# Patient Record
Sex: Female | Born: 1965 | Race: White | Hispanic: No | Marital: Married | Smoking: Never smoker
Health system: Southern US, Community
[De-identification: ages and names within clinical notes are randomized; demographics above are authoritative.]

## PROBLEM LIST (undated history)

## (undated) DIAGNOSIS — I10 Essential (primary) hypertension: Secondary | ICD-10-CM

## (undated) DIAGNOSIS — H269 Unspecified cataract: Secondary | ICD-10-CM

## (undated) DIAGNOSIS — H53009 Unspecified amblyopia, unspecified eye: Secondary | ICD-10-CM

## (undated) DIAGNOSIS — G459 Transient cerebral ischemic attack, unspecified: Secondary | ICD-10-CM

## (undated) HISTORY — DX: Unspecified amblyopia, unspecified eye: H53.009

## (undated) HISTORY — DX: Transient cerebral ischemic attack, unspecified: G45.9

## (undated) HISTORY — DX: Unspecified cataract: H26.9

## (undated) HISTORY — DX: Essential (primary) hypertension: I10

---
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
## 2006-06-14 HISTORY — PX: ECTOPIC PREGNANCY SURGERY: SHX613

## 2007-01-01 ENCOUNTER — Other Ambulatory Visit: Payer: Self-pay

## 2007-01-01 ENCOUNTER — Observation Stay: Payer: Self-pay | Admitting: Obstetrics & Gynecology

## 2007-01-13 HISTORY — PX: ECTOPIC PREGNANCY SURGERY: SHX613

## 2007-10-13 DIAGNOSIS — G459 Transient cerebral ischemic attack, unspecified: Secondary | ICD-10-CM

## 2007-10-13 HISTORY — DX: Transient cerebral ischemic attack, unspecified: G45.9

## 2007-11-10 ENCOUNTER — Inpatient Hospital Stay: Payer: Self-pay | Admitting: Internal Medicine

## 2007-11-10 ENCOUNTER — Other Ambulatory Visit: Payer: Self-pay

## 2007-11-13 ENCOUNTER — Ambulatory Visit: Payer: Self-pay | Admitting: *Deleted

## 2010-09-09 ENCOUNTER — Other Ambulatory Visit: Payer: Self-pay | Admitting: Internal Medicine

## 2010-10-01 ENCOUNTER — Ambulatory Visit: Payer: Self-pay | Admitting: Cardiology

## 2010-11-10 ENCOUNTER — Ambulatory Visit: Payer: Self-pay | Admitting: Obstetrics & Gynecology

## 2010-11-16 ENCOUNTER — Ambulatory Visit: Payer: Self-pay | Admitting: Obstetrics & Gynecology

## 2010-11-20 ENCOUNTER — Ambulatory Visit: Payer: Self-pay | Admitting: Obstetrics & Gynecology

## 2011-01-15 ENCOUNTER — Ambulatory Visit: Payer: Self-pay

## 2011-01-15 ENCOUNTER — Other Ambulatory Visit: Payer: Self-pay | Admitting: Radiology

## 2012-01-07 ENCOUNTER — Ambulatory Visit: Payer: Self-pay

## 2012-01-07 LAB — HCG, QUANTITATIVE, PREGNANCY: Beta Hcg, Quant.: <1 m[IU]/mL — ABNORMAL LOW

## 2012-08-02 IMAGING — MG MAM DGTL SCREENING MAMMO W/CAD
1 series · 4 of 4 positions shown · non-contrast
Comparison: 01/16/2004 from [REDACTED].

REASON FOR EXAM: screening
COMMENTS:  Submitted by practice: [facility] Scheduled by user: Darshna
Ylander

PROCEDURE:     MAM - MAM DGTL SCREENING MAMMO W/CAD  - November 16, 2010  [DATE]
RESULT:

[Series 866: R CC · right · 4 of 4 slices shown]
[im 1/4]
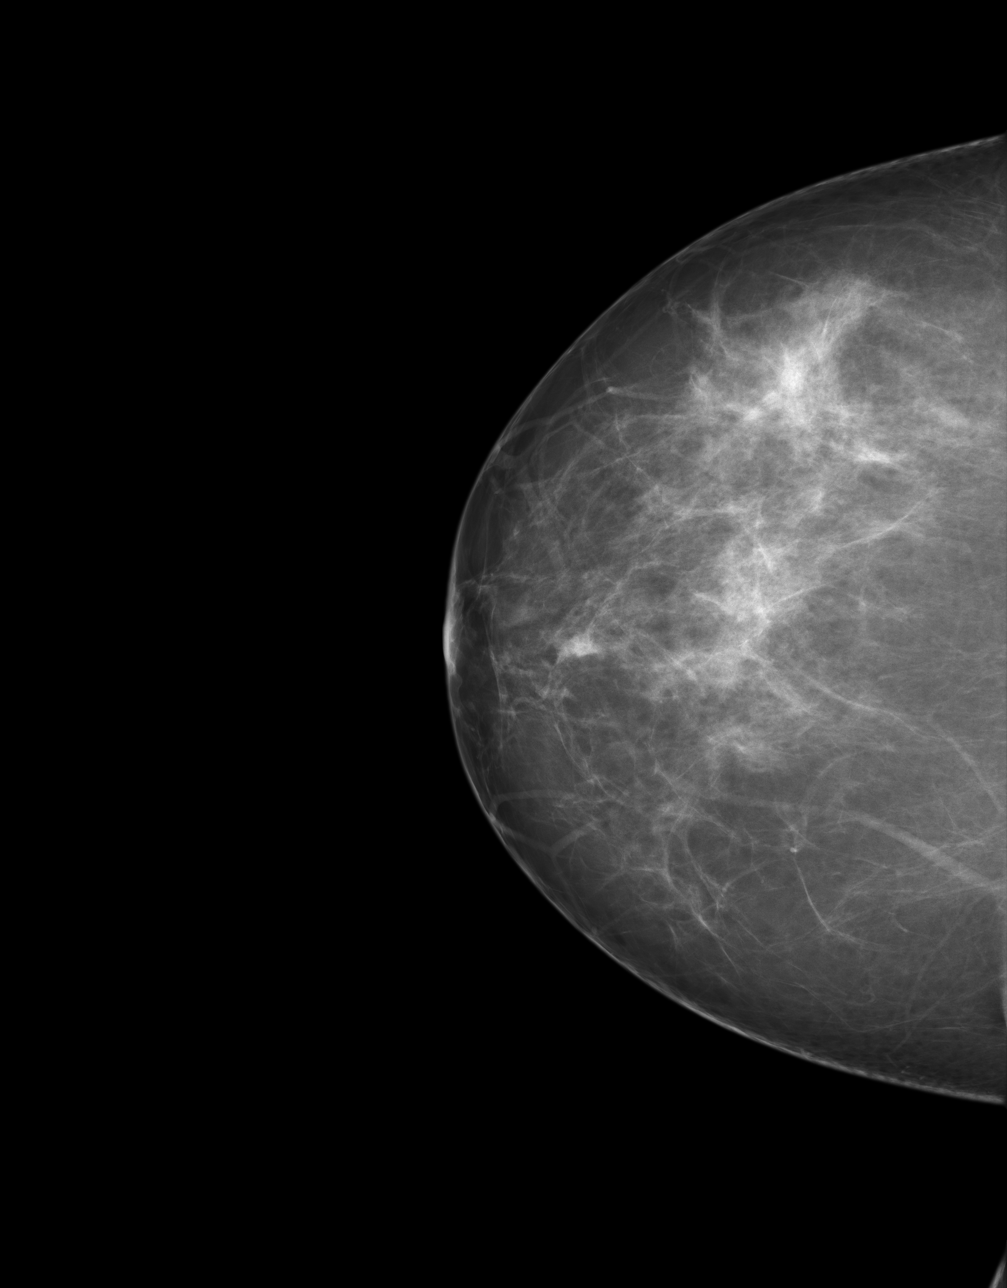
[im 2/4]
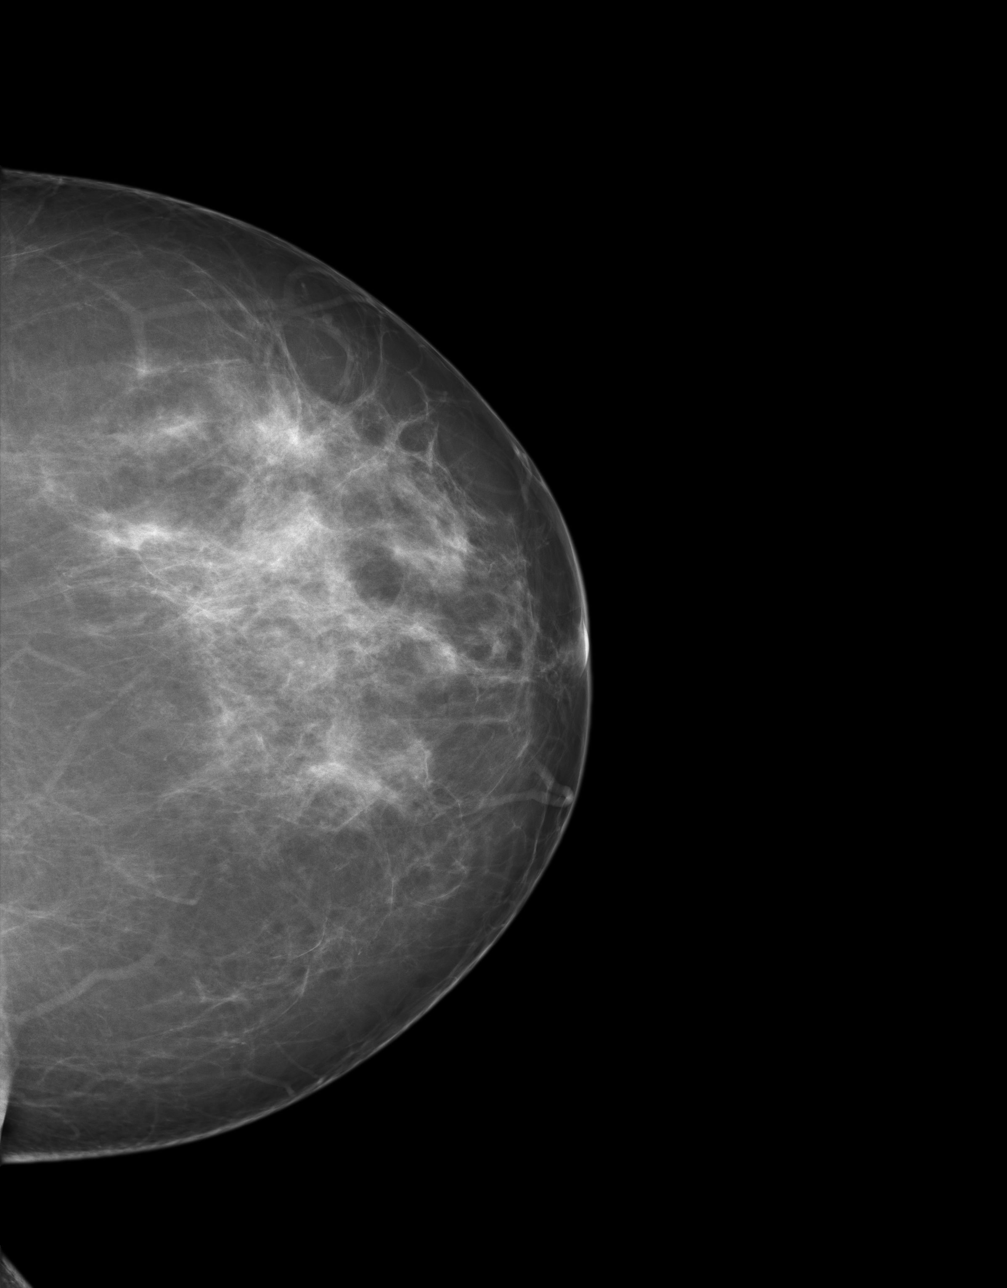
[im 3/4]
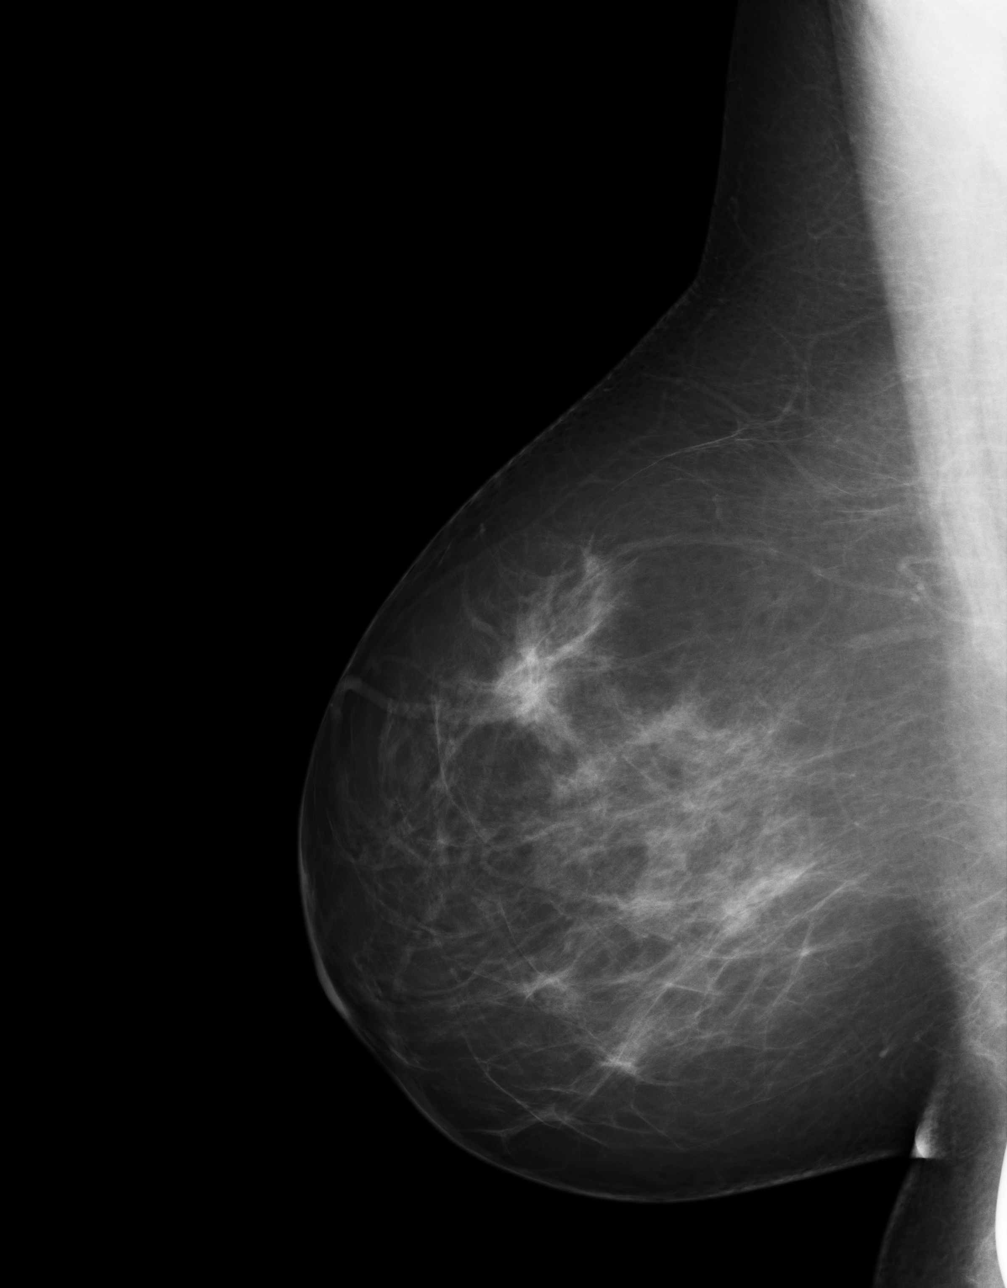
[im 4/4]
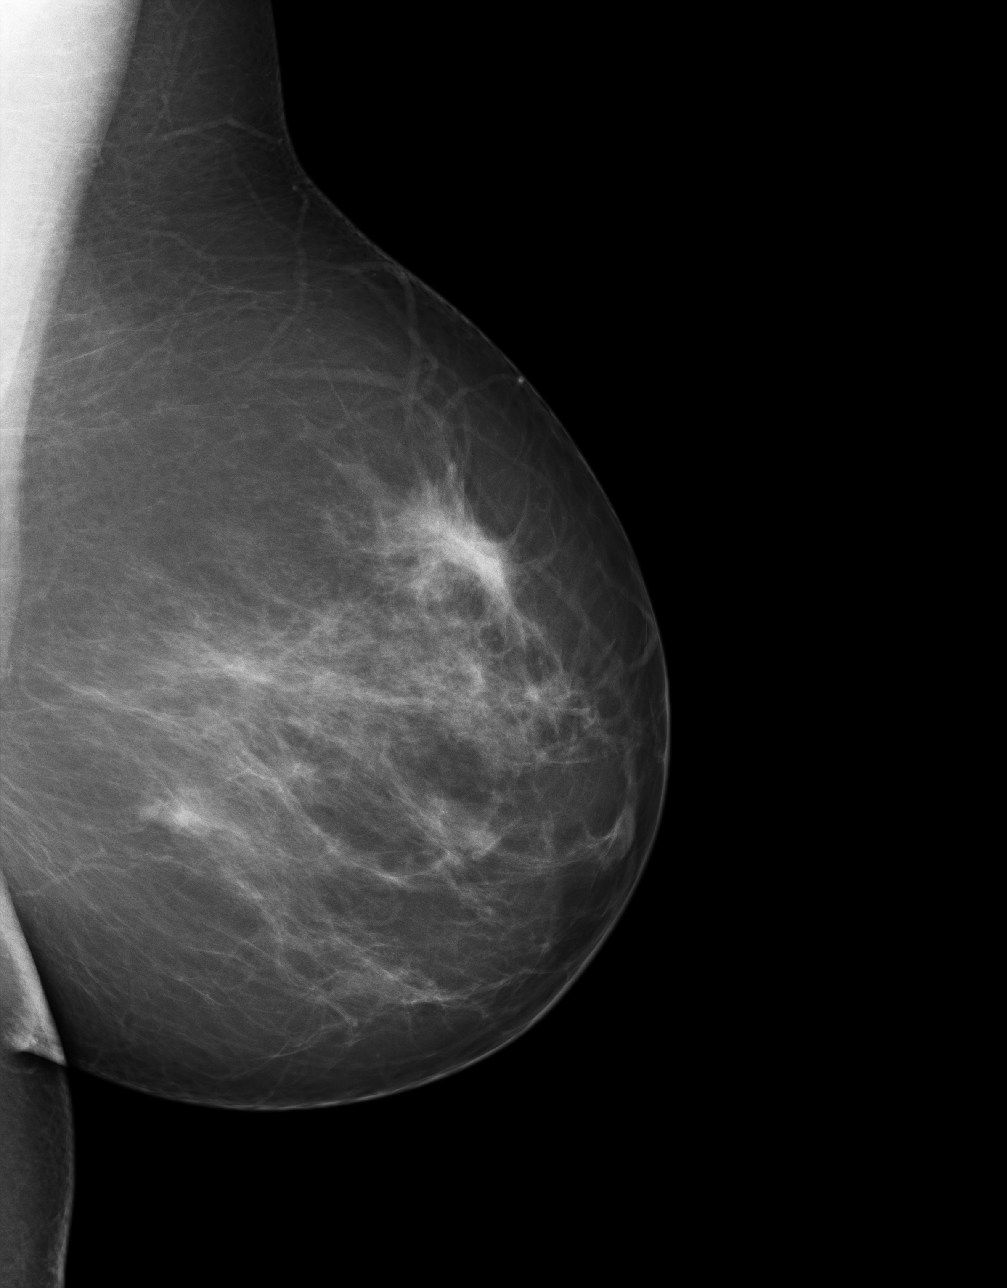

[4 of 4 positions shown; findings below may reference images not displayed]

FINDINGS: Bilateral breasts demonstrate a scattered fibroglandular density.
There is no new dominant mass, architectural distortion or clusters of
suspicious microcalcifications. There is an area of asymmetry in the lateral
right breast.
IMPRESSION: Slight asymmetry in the lateral right breast. Recommend
spot compression views for further evaluation.

BI-RADS:  Category 0 - Needs Additional Imaging Evaluation

Thank you for this opportunity to contribute to the care of your patient.

A NEGATIVE MAMMOGRAM REPORT DOES NOT PRECLUDE BIOPSY OR OTHER EVALUATION OF
A CLINICALLY PALPABLE OR OTHERWISE SUSPICIOUS MASS OR LESION. BREAST CANCER
MAY NOT BE DETECTED BY MAMMOGRAPHY IN UP TO 10% OF CASES.

## 2012-09-13 ENCOUNTER — Encounter: Payer: Self-pay | Admitting: General Surgery

## 2012-09-13 ENCOUNTER — Ambulatory Visit (INDEPENDENT_AMBULATORY_CARE_PROVIDER_SITE_OTHER): Payer: BC Managed Care – PPO | Admitting: General Surgery

## 2012-09-13 VITALS — BP 134/78 | HR 74 | Resp 14 | Ht 70.0 in | Wt 225.0 lb

## 2012-09-13 DIAGNOSIS — L729 Follicular cyst of the skin and subcutaneous tissue, unspecified: Secondary | ICD-10-CM

## 2012-09-13 DIAGNOSIS — L723 Sebaceous cyst: Secondary | ICD-10-CM

## 2012-09-13 NOTE — Patient Instructions (Addendum)
Patient to use heat and let us know if the area is getting bigger. Do not use creams or powders on the area. Keep the  area clean.

## 2012-09-13 NOTE — Progress Notes (Signed)
Patient ID: Ellen Clark, female   DOB: 02/23/1966, 47 y.o.   MRN: [id number]  Chief Complaint  Patient presents with  . Other    left axilla    HPI Ellen Clark is a 47 y.o. female here today for her left axiliary lump, has been hurting about three week.She feels the lump is less notable now. Never had any breast problems before.  HPI  Past Medical History  Diagnosis Date  . TIA (transient ischemic attack) May 2009  . Cataract     congenital right  . 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 Ectopic pregnancy surgery  Aug 2008  . Hernia repair  1974  . Ectopic pregnancy surgery  2008    Family History  Problem Relation Age of Onset  . Lung disease Mother     lung transplant  . Heart disease Father     MI  . Cancer Father     prostate    Social History History  Substance Use Topics  . Smoking status: Never Smoker   . Smokeless tobacco: Never Used  . Alcohol Use: Yes     Comment: 2-3 glasss wine/week    Allergies  Allergen Reactions  . Ace Inhibitors Cough  . Erythromycin Nausea And Vomiting  . Shellfish Allergy     scallops    Current Outpatient Prescriptions  Medication Sig Dispense Refill  . aspirin 81 MG tablet Take 81 mg by mouth daily.      . chlorthalidone (HYGROTON) 25 MG tablet Take 25 mg by mouth daily.       Marland Kitchen loratadine (CLARITIN) 10 MG tablet Take 10 mg by mouth daily as needed for allergies.      . Multiple Vitamins-Minerals (MULTIVITAMIN WITH MINERALS) tablet Take 1 tablet by mouth daily.      Marland Kitchen oxymetazoline (AFRIN) 0.05 % nasal spray Place 2 sprays into the nose 2 (two) times daily as needed for congestion.      . phenylephrine (SUDAFED PE) 10 MG TABS Take 10 mg by mouth every 4 (four) hours as needed.       No current facility-administered medications for this visit.    Review of Systems Review of Systems  Constitutional: Negative.   Respiratory: Negative.     There were no vitals taken for this  visit.  Physical Exam Physical Exam  Neck: No mass and no thyromegaly present.  Cardiovascular: Normal rate, regular rhythm and normal heart sounds.   Pulmonary/Chest: Right breast exhibits no inverted nipple, no mass, no nipple discharge, no skin change and no tenderness. Left breast exhibits no inverted nipple, no mass, no nipple discharge, no skin change and no tenderness.  Lymphadenopathy:    She has no cervical adenopathy.    She has no axillary adenopathy (left ).  Skin:       Data Reviewed    Assessment    Skin cyst left axilla     Plan    No need for antibiotic or excision now.        Ples Specter 09/13/2012, 9:20 AM

## 2014-04-15 ENCOUNTER — Encounter: Payer: Self-pay | Admitting: General Surgery

## 2016-08-25 ENCOUNTER — Ambulatory Visit (INDEPENDENT_AMBULATORY_CARE_PROVIDER_SITE_OTHER): Payer: BC Managed Care – PPO | Admitting: Obstetrics & Gynecology

## 2016-08-25 ENCOUNTER — Encounter: Payer: Self-pay | Admitting: Obstetrics & Gynecology

## 2016-08-25 VITALS — BP 120/80 | HR 80 | Ht 70.0 in | Wt 233.0 lb

## 2016-08-25 DIAGNOSIS — Z0000 Encounter for general adult medical examination without abnormal findings: Secondary | ICD-10-CM

## 2016-08-25 NOTE — Progress Notes (Signed)
HPI:      [name] is a 51 y.o. G2P0010 who LMP was Patient's last menstrual period was 08/11/2016.she presents today for her annual examination. The patient has no complaints today. The patient is sexually active. 2016  last pap: was normal  The patient has regular exercise: yes.  Reg periods, no vasomotor c/os.  On Cabergoline (Dr Tedd Sias) which has helped regulate periods.  GYN History: Contraception: tubal ligation  PMHx: She  has a past medical history of Cataract; Hypertension; Lazy eye (1980); and TIA (transient ischemic attack) (May 2009). Also,  has a past surgical history that includes Ectopic pregnancy surgery (Aug 2008); Hernia repair (1974); and Ectopic pregnancy surgery (2008)., family history includes Cancer in her father; Heart disease in her father; Lung disease in her mother.,  reports that she has never smoked. She has never used smokeless tobacco. She reports that she drinks alcohol. She reports that she does not use drugs.  She has a current medication list which includes the following prescription(s): cabergoline, triamterene-hydrochlorothiazide, aspirin ec, loratadine, and multivitamin with minerals. Also, is allergic to ace inhibitors; erythromycin; and shellfish allergy.  Review of Systems  Constitutional: Negative for chills, fever and malaise/fatigue.  HENT: Negative for congestion, sinus pain and sore throat.   Eyes: Negative for blurred vision and pain.  Respiratory: Negative for cough and wheezing.   Cardiovascular: Negative for chest pain and leg swelling.  Gastrointestinal: Negative for abdominal pain, constipation, diarrhea, heartburn, nausea and vomiting.  Genitourinary: Negative for dysuria, frequency, hematuria and urgency.  Musculoskeletal: Negative for back pain, joint pain, myalgias and neck pain.  Skin: Negative for itching and rash.  Neurological: Negative for dizziness, tremors and weakness.  Endo/Heme/Allergies: Does not bruise/bleed easily.   Psychiatric/Behavioral: Negative for depression. The patient is not nervous/anxious and does not have insomnia.     Objective: BP 120/80   Pulse 80   Ht 5\' 10"  (1.778 m)   Wt 233 lb (105.7 kg)   LMP 08/11/2016   BMI 33.43 kg/m  Physical Exam  Constitutional: She is oriented to person, place, and time. She appears well-developed and well-nourished. No distress.  Genitourinary: Rectum normal, vagina normal and uterus normal. Pelvic exam was performed with patient supine. There is no rash or lesion on the right labia. There is no rash or lesion on the left labia. Vagina exhibits no lesion. No bleeding in the vagina. Right adnexum does not display mass and does not display tenderness. Left adnexum does not display mass and does not display tenderness. Cervix does not exhibit motion tenderness, lesion, friability or polyp.   Uterus is mobile and midaxial. Uterus is not enlarged or exhibiting a mass.  HENT:  Head: Normocephalic and atraumatic. Head is without laceration.  Right Ear: Hearing normal.  Left Ear: Hearing normal.  Nose: No epistaxis.  No foreign bodies.  Mouth/Throat: Uvula is midline, oropharynx is clear and moist and mucous membranes are normal.  Eyes: Pupils are equal, round, and reactive to light.  Neck: Normal range of motion. Neck supple. No thyromegaly present.  Cardiovascular: Normal rate and regular rhythm.  Exam reveals no gallop and no friction rub.   No murmur heard. Pulmonary/Chest: Effort normal and breath sounds normal. No respiratory distress. She has no wheezes.  Abdominal: Soft. Bowel sounds are normal. She exhibits no distension. There is no tenderness. There is no rebound.  Musculoskeletal: Normal range of motion.  Neurological: She is alert and oriented to person, place, and time. No cranial nerve  deficit.  Skin: Skin is warm and dry.  Psychiatric: She has a normal mood and affect. Judgment normal.  Vitals reviewed.   Assessment:  ANNUAL EXAM 1. Annual  physical exam      Screening Plan:            1.  Cervical Screening-  Pap smear not performed, every 3 years (2019)  2. Breast screening- Exam annually and mammogram>40 planned   3. Colonoscopy every 10 years, Hemoccult testing - after age 49  4. Labs per PCP   5. Counseling for contraception: bilateral tubal ligation     F/U  Return in about 1 year (around 08/25/2017) for Annual with Mammogram.  Annamarie MajorPaul Valentine Kuechle, MD, Merlinda FrederickFACOG [facility], [facility] 08/25/2016  9:32 AM

## 2020-02-13 ENCOUNTER — Other Ambulatory Visit: Payer: Self-pay

## 2020-02-13 ENCOUNTER — Other Ambulatory Visit (HOSPITAL_COMMUNITY)
Admission: RE | Admit: 2020-02-13 | Discharge: 2020-02-13 | Disposition: A | Discharge status: Home / Self Care | Payer: BC Managed Care – PPO | Source: Ambulatory Visit | Attending: Obstetrics & Gynecology | Admitting: Obstetrics & Gynecology

## 2020-02-13 ENCOUNTER — Ambulatory Visit (INDEPENDENT_AMBULATORY_CARE_PROVIDER_SITE_OTHER): Payer: BC Managed Care – PPO | Admitting: Obstetrics & Gynecology

## 2020-02-13 ENCOUNTER — Encounter: Payer: Self-pay | Admitting: Obstetrics & Gynecology

## 2020-02-13 VITALS — BP 120/80 | Ht 70.0 in | Wt 246.0 lb

## 2020-02-13 DIAGNOSIS — Z01419 Encounter for gynecological examination (general) (routine) without abnormal findings: Secondary | ICD-10-CM

## 2020-02-13 DIAGNOSIS — Z1231 Encounter for screening mammogram for malignant neoplasm of breast: Secondary | ICD-10-CM

## 2020-02-13 DIAGNOSIS — Z124 Encounter for screening for malignant neoplasm of cervix: Secondary | ICD-10-CM | POA: Diagnosis not present

## 2020-02-13 NOTE — Patient Instructions (Addendum)
PAP every three years Mammogram every year    Call [phone] to schedule at [facility] Colonoscopy every 10 years Labs yearly (with PCP)  Thank you for choosing [facility]. As part of our ongoing efforts to improve patient experience, we would appreciate your feedback. Please fill out the short survey that you will receive by mail or MyChart. Your opinion is important to [place]! - Dr. Tiburcio Pea   Kegel Exercises  Kegel exercises can help strengthen your pelvic floor muscles. The pelvic floor is a group of muscles that support your rectum, small intestine, and bladder. In females, pelvic floor muscles also help support the womb (uterus). These muscles help you control the flow of urine and stool. Kegel exercises are painless and simple, and they do not require any equipment. Your provider may suggest Kegel exercises to:  Improve bladder and bowel control.  Improve sexual response.  Improve weak pelvic floor muscles after surgery to remove the uterus (hysterectomy) or pregnancy (females).  Improve weak pelvic floor muscles after prostate gland removal or surgery (males). Kegel exercises involve squeezing your pelvic floor muscles, which are the same muscles you squeeze when you try to stop the flow of urine or keep from passing gas. The exercises can be done while sitting, standing, or lying down, but it is best to vary your position. Exercises How to do Kegel exercises: 1. Squeeze your pelvic floor muscles tight. You should feel a tight lift in your rectal area. If you are a female, you should also feel a tightness in your vaginal area. Keep your stomach, buttocks, and legs relaxed. 2. Hold the muscles tight for up to 10 seconds. 3. Breathe normally. 4. Relax your muscles. 5. Repeat as told by your health care provider. Repeat this exercise daily as told by your health care provider. Continue to do this exercise for at least 4-6 weeks, or for as long as told by your health care  provider. You may be referred to a physical therapist who can help you learn more about how to do Kegel exercises. Depending on your condition, your health care provider may recommend:  Varying how long you squeeze your muscles.  Doing several sets of exercises every day.  Doing exercises for several weeks.  Making Kegel exercises a part of your regular exercise routine. This information is not intended to replace advice given to you by your health care provider. Make sure you discuss any questions you have with your health care provider. Document Revised: 01/18/2018 Document Reviewed: 01/18/2018 Elsevier Patient Education  2020 [employer].

## 2020-02-13 NOTE — Progress Notes (Signed)
HPI:      [name] is a 54 y.o. G2P0010 who LMP was Patient's last menstrual period was 01/30/2020., she presents today for her annual examination. The patient has the following complaints today. Periods more irreg but monthly (q 3-6 weeks); occas gray or other discolorations.  Feels vag know for about a year.  Occas hot fhashes.  Weight gain.  Still has prolactin concerns on meds uder control.  LOU w cough or vomiting, rare other times.  The patient is sexually active. Her last pap: approximate date 2018 and was normal and last mammogram: approximate date 2018 and was normal. The patient does perform self breast exams.  There is no notable family history of breast or ovarian cancer in her family.  The patient has regular exercise: yes.  The patient denies current symptoms of depression.    GYN History: Contraception: tubal ligation  PMHx: Past Medical History:  Diagnosis Date  . Cataract    congenital right  . Hypertension   . Lazy eye 1980   right  . TIA (transient ischemic attack) May 2009   Past Surgical History:  Procedure Laterality Date  . ECTOPIC PREGNANCY SURGERY  Aug 2008  . ECTOPIC PREGNANCY SURGERY  2008  . HERNIA REPAIR  1974   Family History  Problem Relation Age of Onset  . Lung disease Mother        lung transplant  . Heart disease Father        MI  . Cancer Father        prostate   Social History   Tobacco Use  . Smoking status: Never Smoker  . Smokeless tobacco: Never Used  Vaping Use  . Vaping Use: Never used  Substance Use Topics  . Alcohol use: Yes    Comment: 2-3 glasss wine/week  . Drug use: No    Current Outpatient Medications:  .  aspirin EC 81 MG tablet, Take by mouth., Disp: , Rfl:  .  cabergoline (DOSTINEX) 0.5 MG tablet, Take by mouth., Disp: , Rfl:  .  loratadine (CLARITIN) 10 MG tablet, Take by mouth., Disp: , Rfl:  .  Multiple Vitamins-Minerals (MULTIVITAMIN WITH MINERALS) tablet, Take by mouth., Disp: , Rfl:  .   triamterene-hydrochlorothiazide (MAXZIDE-25) 37.5-25 MG tablet, Take by mouth., Disp: , Rfl:  Allergies: Ace inhibitors, Erythromycin, and Shellfish allergy  Review of Systems  Constitutional: Negative for chills, fever and malaise/fatigue.  HENT: Negative for congestion, sinus pain and sore throat.   Eyes: Negative for blurred vision and pain.  Respiratory: Negative for cough and wheezing.   Cardiovascular: Negative for chest pain and leg swelling.  Gastrointestinal: Negative for abdominal pain, constipation, diarrhea, heartburn, nausea and vomiting.  Genitourinary: Negative for dysuria, frequency, hematuria and urgency.  Musculoskeletal: Negative for back pain, joint pain, myalgias and neck pain.  Skin: Negative for itching and rash.  Neurological: Negative for dizziness, tremors and weakness.  Endo/Heme/Allergies: Does not bruise/bleed easily.  Psychiatric/Behavioral: Negative for depression. The patient is not nervous/anxious and does not have insomnia.     Objective: BP 120/80   Ht 5\' 10"  (1.778 m)   Wt 246 lb (111.6 kg)   LMP 01/30/2020   BMI 35.30 kg/m   Filed Weights   02/13/20 0830  Weight: 246 lb (111.6 kg)   Body mass index is 35.3 kg/m. Physical Exam Constitutional:      General: She is not in acute distress.    Appearance: She is well-developed.  Genitourinary:  Pelvic exam was performed with patient supine.     Vagina, uterus and rectum normal.     No lesions in the vagina.     No vaginal bleeding.     No cervical motion tenderness, friability, lesion or polyp.     Uterus is mobile.     Uterus is not enlarged.     No uterine mass detected.    Uterus is midaxial.     No right or left adnexal mass present.     Right adnexa not tender.     Left adnexa not tender.     Genitourinary Comments: Vag know pea sized NT on post vag wall near intraitus  HENT:     Head: Normocephalic and atraumatic. No laceration.     Right Ear: Hearing normal.     Left Ear:  Hearing normal.     Mouth/Throat:     Pharynx: Uvula midline.  Eyes:     Pupils: Pupils are equal, round, and reactive to light.  Neck:     Thyroid: No thyromegaly.  Cardiovascular:     Rate and Rhythm: Normal rate and regular rhythm.     Heart sounds: No murmur heard.  No friction rub. No gallop.   Pulmonary:     Effort: Pulmonary effort is normal. No respiratory distress.     Breath sounds: Normal breath sounds. No wheezing.  Chest:     Breasts:        Right: No mass, skin change or tenderness.        Left: No mass, skin change or tenderness.  Abdominal:     General: Bowel sounds are normal. There is no distension.     Palpations: Abdomen is soft.     Tenderness: There is no abdominal tenderness. There is no rebound.  Musculoskeletal:        General: Normal range of motion.     Cervical back: Normal range of motion and neck supple.  Neurological:     Mental Status: She is alert and oriented to person, place, and time.     Cranial Nerves: No cranial nerve deficit.  Skin:    General: Skin is warm and dry.  Psychiatric:        Judgment: Judgment normal.  Vitals reviewed.     Assessment:  ANNUAL EXAM 1. Women's annual routine gynecological examination   2. Screening for cervical cancer   3. Encounter for screening mammogram for malignant neoplasm of breast      Screening Plan:            1.  Cervical Screening-  Pap smear done today  2. Breast screening- Exam annually and mammogram>40 planned   3. Colonoscopy every 10 years, Hemoccult testing - after age 44  4. Labs managed by PCP  5. Counseling for contraception: bilateral tubal ligation   6.  No vaginal concerns, monitor nodule as it has no pain and no signs of cancer or disease process.  Monitor cycles and sx's of menopause.  7. Mild GSI, monitor sx's. Options discussed.  Kegels encouraged.     F/U  Return in about 1 year (around 02/12/2021) for Annual.  Ellen Major, MD, [facility], [facility] 02/13/2020  9:12 AM

## 2020-02-14 LAB — CYTOLOGY - PAP
Comment: NEGATIVE
Diagnosis: NEGATIVE
High risk HPV: NEGATIVE

## 2020-02-14 NOTE — Progress Notes (Signed)
Pt aware.

## 2020-03-19 ENCOUNTER — Ambulatory Visit
Admission: RE | Admit: 2020-03-19 | Discharge: 2020-03-19 | Disposition: A | Discharge status: Home / Self Care | Payer: BC Managed Care – PPO | Source: Ambulatory Visit | Attending: Obstetrics & Gynecology | Admitting: Obstetrics & Gynecology

## 2020-03-19 ENCOUNTER — Other Ambulatory Visit: Payer: Self-pay

## 2020-03-19 DIAGNOSIS — Z1231 Encounter for screening mammogram for malignant neoplasm of breast: Secondary | ICD-10-CM | POA: Diagnosis not present

## 2020-03-24 ENCOUNTER — Encounter: Payer: Self-pay | Admitting: Obstetrics & Gynecology

## 2022-01-21 ENCOUNTER — Encounter: Payer: Self-pay | Admitting: Licensed Practical Nurse

## 2022-01-21 ENCOUNTER — Ambulatory Visit (INDEPENDENT_AMBULATORY_CARE_PROVIDER_SITE_OTHER): Payer: BC Managed Care – PPO | Admitting: Licensed Practical Nurse

## 2022-01-21 VITALS — BP 120/70 | Ht 70.0 in | Wt 242.0 lb

## 2022-01-21 DIAGNOSIS — Z01419 Encounter for gynecological examination (general) (routine) without abnormal findings: Secondary | ICD-10-CM | POA: Diagnosis not present

## 2022-01-21 DIAGNOSIS — S20111A Abrasion of breast, right breast, initial encounter: Secondary | ICD-10-CM

## 2022-01-21 DIAGNOSIS — W57XXXA Bitten or stung by nonvenomous insect and other nonvenomous arthropods, initial encounter: Secondary | ICD-10-CM

## 2022-01-21 NOTE — Progress Notes (Signed)
Gynecology Annual Exam  PCP: Danella Penton, MD  Chief Complaint:  Chief Complaint  Patient presents with   Annual Exam    History of Present Illness:Patient is a 56 y.o. G2P0010 presents for annual exam. The patient has what she thinks is a bug bite on her right breast, it appeared about 2 days ago, it doe snot itch and is not painful.  Reports having a nodule in her right vaginal wall, it has been there for a number of years, it has not changed in size and does not cause any pain.   LMP: Patient's last menstrual period was 10/31/2021.  Average Interval: regular, varies can go a few months without a cycle Duration of flow: a few days Heavy Menses: usually not, but it was heavier 2 cycles ago Clots: no Intermenstrual Bleeding: no Postcoital Bleeding: no Dysmenorrhea: no  The patient is sexually active. She denies dyspareunia.  The patient does perform self breast exams.  There is no notable family history of breast or ovarian cancer in her family.  The patient wears seatbelts: yes.   The patient has regular exercise: yes.  Walking Works in [employer] for [employer], retires in Nov. Plans to do extensive traveling in their RV  Lives with her husband, feels safe PCP last seen in March, followed by Dr Dr Dalbert Mayotte for pituitary concerns  Wears glass eye exam up to date Dental exam up to date   The patient denies current symptoms of depression.     Review of Systems: Review of Systems  Constitutional: Negative.   Respiratory: Negative.    Cardiovascular: Negative.   Gastrointestinal: Negative.   Genitourinary: Negative.   Musculoskeletal: Negative.   Neurological: Negative.   Psychiatric/Behavioral: Negative.      Past Medical History:  Patient Active Problem List   Diagnosis Date Noted   Skin cyst 09/13/2012    Past Surgical History:  Past Surgical History:  Procedure Laterality Date   ECTOPIC PREGNANCY SURGERY  Aug 2008   ECTOPIC PREGNANCY SURGERY  2008    HERNIA REPAIR  1974    Gynecologic History:  Patient's last menstrual period was 10/31/2021. Last Pap: Results were: 2021 no abnormalities  Last mammogram: 2021 Results were: BI-RAD I  Obstetric History: G2P0010  Family History:  Family History  Problem Relation Age of Onset   Lung disease Mother        lung transplant   Heart disease Father        MI   Cancer Father        prostate   Breast cancer Neg Hx     Social History:  Social History   Socioeconomic History   Marital status: Married    Spouse name: Not on file   Number of children: Not on file   Years of education: Not on file   Highest education level: Not on file  Occupational History   Not on file  Tobacco Use   Smoking status: Never   Smokeless tobacco: Never  Vaping Use   Vaping Use: Never used  Substance and Sexual Activity   Alcohol use: Yes    Comment: 2-3 glasss wine/week   Drug use: No   Sexual activity: Yes    Birth control/protection: None  Other Topics Concern   Not on file  Social History Narrative   Not on file   Social Determinants of Health   Financial Resource Strain: Not on file  Food Insecurity: Not on file  Transportation  Needs: Not on file  Physical Activity: Not on file  Stress: Not on file  Social Connections: Not on file  Intimate Partner Violence: Not on file    Allergies:  Allergies  Allergen Reactions   Ace Inhibitors Cough   Erythromycin Nausea And Vomiting   Shellfish Allergy     scallops    Medications: Prior to Admission medications   Medication Sig Start Date End Date Taking? Authorizing Provider  aspirin EC 81 MG tablet Take by mouth.   Yes [provider]  cabergoline (DOSTINEX) 0.5 MG tablet Take by mouth. 06/03/16  Yes [provider]  loratadine (CLARITIN) 10 MG tablet Take by mouth.   Yes [provider]  Multiple Vitamins-Minerals (MULTIVITAMIN WITH MINERALS) tablet Take by mouth.   Yes [provider]   triamterene-hydrochlorothiazide (MAXZIDE-25) 37.5-25 MG tablet Take by mouth. 08/05/16  Yes [provider]    Physical Exam Vitals: Blood pressure 120/70, height 5\' 10"  (1.778 m), weight 242 lb (109.8 kg), last menstrual period 10/31/2021.  General: NAD HEENT: normocephalic, anicteric Thyroid: no enlargement, no palpable nodules Pulmonary: No increased work of breathing, CTAB Cardiovascular: RRR, distal pulses 2+ Breast: Breast symmetrical, no tenderness, no palpable nodules or masses, no skin or nipple retraction present, no nipple discharge.  No axillary or supraclavicular lymphadenopathy. Right breast 1cm area of redness with yellow center on upper inner quadrant, non tender Abdomen: NABS, soft, non-tender, non-distended.  Umbilicus without lesions.  No hepatomegaly, splenomegaly or masses palpable. No evidence of hernia  Genitourinary:  External: Normal external female genitalia.  Normal urethral meatus, normal Bartholin's and Skene's glands.    Vagina: Normal vaginal mucosa, no evidence of prolapse. Good tone  Small pea size hard mass palpated near right vaginal wall at the introitus, not visible but easily palpable.   Cervix: Grossly normal in appearance, no bleeding  Uterus: Non-enlarged, mobile, normal contour.  No CMT  Adnexa: ovaries non-enlarged, no adnexal masses  Rectal: deferred  Lymphatic: no evidence of inguinal lymphadenopathy Extremities: no edema, erythema, or tenderness Neurologic: Grossly intact Psychiatric: mood appropriate, affect full      Assessment: 56 y.o. G2P0010 routine annual exam  Plan: Problem List Items Addressed This Visit   None Visit Diagnoses     Well woman exam    -  Primary   Relevant Orders   MM 3D SCREEN BREAST BILATERAL       1) Mammogram - recommend yearly screening mammogram.  Mammogram Was ordered today  2) STI screening  wasoffered and declined  3) ASCCP guidelines and rational discussed.  Patient opts for every 3  years screening interval Due in 2024   4) Osteoporosis  - per USPTF routine screening DEXA at age 19   Consider FDA-approved medical therapies in postmenopausal women and men aged 59 years and older, based on the following: a) A hip or vertebral (clinical or morphometric) fracture b) T-score ? -2.5 at the femoral neck or spine after appropriate evaluation to exclude secondary causes C) Low bone mass (T-score between -1.0 and -2.5 at the femoral neck or spine) and a 10-year probability of a hip fracture ? 3% or a 10-year probability of a major osteoporosis-related fracture ? 20% based on the [place]-adapted WHO algorithm   5) Routine healthcare maintenance including cholesterol, diabetes screening discussed managed by PCP  6) Colonoscopy UP to Date.  Screening recommended starting at age 5 for average risk individuals, age 52 for individuals deemed at increased risk (including African Americans) and recommended to  continue until age 56.  For patient age 3-85 individualized approach is recommended.  Gold standard screening is via colonoscopy, Cologuard screening is an acceptable alternative for patient unwilling or unable to undergo colonoscopy.  "Colorectal cancer screening for average?risk adults: 2018 guideline update from the American Cancer Society"CA: A Cancer Journal for Clinicians: Nov 10, 2016   7) Return in about 1 year (around 01/22/2023).  8) lesion on Breast: will monitor at home for now, if are worsens or does not improve may need to come back in.  9) Vaginal wall nodule: pt prefers to monitor and check in 1 year   Carie Caddy, CNM  [facility] 01/21/2022, 8:51 AM

## 2022-02-18 ENCOUNTER — Ambulatory Visit
Admission: RE | Admit: 2022-02-18 | Discharge: 2022-02-18 | Disposition: A | Discharge status: Home / Self Care | Payer: BC Managed Care – PPO | Source: Ambulatory Visit | Attending: Licensed Practical Nurse | Admitting: Licensed Practical Nurse

## 2022-02-18 DIAGNOSIS — Z01419 Encounter for gynecological examination (general) (routine) without abnormal findings: Secondary | ICD-10-CM | POA: Insufficient documentation

## 2022-02-18 DIAGNOSIS — Z1231 Encounter for screening mammogram for malignant neoplasm of breast: Secondary | ICD-10-CM | POA: Insufficient documentation

## 2022-09-02 ENCOUNTER — Other Ambulatory Visit: Payer: Self-pay | Admitting: Internal Medicine

## 2022-09-02 DIAGNOSIS — R319 Hematuria, unspecified: Secondary | ICD-10-CM

## 2022-09-03 ENCOUNTER — Ambulatory Visit
Admission: RE | Admit: 2022-09-03 | Discharge: 2022-09-03 | Disposition: A | Discharge status: Home / Self Care | Payer: BC Managed Care – PPO | Source: Ambulatory Visit | Attending: Internal Medicine | Admitting: Internal Medicine

## 2022-09-03 ENCOUNTER — Other Ambulatory Visit: Payer: Self-pay | Admitting: Internal Medicine

## 2022-09-03 DIAGNOSIS — R319 Hematuria, unspecified: Secondary | ICD-10-CM | POA: Diagnosis present

## 2023-07-12 ENCOUNTER — Other Ambulatory Visit: Payer: Self-pay | Admitting: Internal Medicine

## 2023-07-12 DIAGNOSIS — Z1231 Encounter for screening mammogram for malignant neoplasm of breast: Secondary | ICD-10-CM

## 2023-10-17 ENCOUNTER — Ambulatory Visit
Admission: RE | Admit: 2023-10-17 | Discharge: 2023-10-17 | Disposition: A | Discharge status: Home / Self Care | Payer: Self-pay | Source: Ambulatory Visit | Attending: Internal Medicine | Admitting: Internal Medicine

## 2023-10-17 DIAGNOSIS — Z1231 Encounter for screening mammogram for malignant neoplasm of breast: Secondary | ICD-10-CM | POA: Insufficient documentation

## 2023-10-19 ENCOUNTER — Ambulatory Visit: Payer: Self-pay | Admitting: Licensed Practical Nurse

## 2023-10-19 ENCOUNTER — Other Ambulatory Visit (HOSPITAL_COMMUNITY)
Admission: RE | Admit: 2023-10-19 | Discharge: 2023-10-19 | Disposition: A | Discharge status: Home / Self Care | Source: Ambulatory Visit | Attending: Licensed Practical Nurse | Admitting: Licensed Practical Nurse

## 2023-10-19 ENCOUNTER — Encounter: Payer: Self-pay | Admitting: Licensed Practical Nurse

## 2023-10-19 VITALS — BP 139/75 | HR 73 | Ht 70.0 in | Wt 247.2 lb

## 2023-10-19 DIAGNOSIS — Z01419 Encounter for gynecological examination (general) (routine) without abnormal findings: Secondary | ICD-10-CM | POA: Insufficient documentation

## 2023-10-19 DIAGNOSIS — N898 Other specified noninflammatory disorders of vagina: Secondary | ICD-10-CM

## 2023-10-19 DIAGNOSIS — N393 Stress incontinence (female) (male): Secondary | ICD-10-CM

## 2023-10-20 NOTE — Progress Notes (Signed)
 Gynecology Annual Exam  PCP: Sari Cunning, MD  Chief Complaint:  Chief Complaint  Patient presents with   Gynecologic Exam    History of Present Illness:Patient is a 58 y.o. G2P0010 presents for annual exam. The patient has no complaints today.   The nodule that she reported at last visit is still present, it has not changed in size, is not tender, can be more obvious with bearing down or when wiping.    LMP: No LMP recorded. (Menstrual status: Perimenopausal).  Average Interval: irregular, had had a cycle in the last year  Heavy Menses: no Clots: no Intermenstrual Bleeding: no Postcoital Bleeding: no Dysmenorrhea: no  The patient is sexually active with 1 female. She denies dyspareunia.  The patient does perform self breast exams.  There is no notable family history of breast or ovarian cancer in her family.  The patient wears seatbelts: yes.   The patient has regular exercise: yes.  walking  The patient denies current symptoms of depression.    Ellen Clark is now retired, she and her husband have been traveling in their RV. They are in [place] for a short period seeing all of their health care providers  Feels safe with her husband Dentist up to date Wears glasses eye exam up to date   Review of Systems: Review of Systems  Constitutional: Negative.   HENT: Negative.    Eyes: Negative.   Respiratory: Negative.    Cardiovascular: Negative.   Genitourinary:        Difficulty holding urine when sneezing or laughing   Musculoskeletal: Negative.   Skin: Negative.   Neurological: Negative.   Endo/Heme/Allergies:  Positive for environmental allergies.       Occasional hotflash   Psychiatric/Behavioral: Negative.      Past Medical History:  Patient Active Problem List   Diagnosis Date Noted Date Diagnosed   Skin cyst 09/13/2012     Past Surgical History:  Past Surgical History:  Procedure Laterality Date   ECTOPIC PREGNANCY SURGERY  Aug 2008   ECTOPIC PREGNANCY SURGERY   2008   HERNIA REPAIR  1974    Gynecologic History:  No LMP recorded. (Menstrual status: Perimenopausal). Last Pap: Results were: 2021 no abnormalities  Last mammogram: 10/2023 Results were: BI-RAD I  Obstetric History: G2P0010  Family History:  Family History  Problem Relation Age of Onset   Lung disease Mother        lung transplant   Heart disease Father        MI   Cancer Father        prostate   Breast cancer Neg Hx     Social History:  Social History   Socioeconomic History   Marital status: Married    Spouse name: Not on file   Number of children: Not on file   Years of education: Not on file   Highest education level: Not on file  Occupational History   Not on file  Tobacco Use   Smoking status: Never   Smokeless tobacco: Never  Vaping Use   Vaping status: Never Used  Substance and Sexual Activity   Alcohol use: Yes    Comment: 2-3 glasss wine/week   Drug use: No   Sexual activity: Yes    Birth control/protection: None  Other Topics Concern   Not on file  Social History Narrative   Not on file   Social Drivers of Health   Financial Resource Strain: Low Risk  (09/01/2022)  Received from [facility], [facility]   Overall Financial Resource Strain (CARDIA)    Difficulty of Paying Living Expenses: Not hard at all  Food Insecurity: No Food Insecurity (09/01/2022)   Received from [facility], [facility]   Hunger Vital Sign    Worried About Running Out of Food in the Last Year: Never true    Ran Out of Food in the Last Year: Never true  Transportation Needs: No Transportation Needs (09/01/2022)   Received from [facility], [facility]   [facility] - Transportation    In the past 12 months, has lack of transportation kept you from medical appointments or from getting medications?: No    Lack of Transportation (Non-Medical): No  Physical Activity:  Not on file  Stress: Not on file  Social Connections: Not on file  Intimate Partner Violence: Not on file    Allergies:  Allergies  Allergen Reactions   Ace Inhibitors Cough   Erythromycin Nausea And Vomiting   Shellfish Allergy     scallops    Medications: Prior to Admission medications   Medication Sig Start Date End Date Taking? Authorizing Provider  aspirin EC 81 MG tablet Take by mouth.   Yes [provider]  cabergoline (DOSTINEX) 0.5 MG tablet Take by mouth. 06/03/16  Yes [provider]  loratadine (CLARITIN) 10 MG tablet Take by mouth.   Yes [provider]  Multiple Vitamins-Minerals (MULTIVITAMIN WITH MINERALS) tablet Take by mouth.   Yes [provider]  triamterene-hydrochlorothiazide (MAXZIDE-25) 37.5-25 MG tablet Take by mouth. 08/05/16  Yes [provider]    Physical Exam Vitals: Blood pressure 139/75, pulse 73, height 5\' 10"  (1.778 m), weight 247 lb 3.2 oz (112.1 kg).  General: NAD HEENT: normocephalic, anicteric Thyroid: no enlargement, no palpable nodules Pulmonary: No increased work of breathing, CTAB Cardiovascular: RRR, distal pulses 2+ Breast: Breast symmetrical, no tenderness, no palpable nodules or masses, no skin or nipple retraction present, no nipple discharge.  No axillary or supraclavicular lymphadenopathy. Abdomen: NABS, soft, non-tender, non-distended.  Umbilicus without lesions.  No hepatomegaly, splenomegaly or masses palpable. No evidence of hernia  Genitourinary:  External: Normal external female genitalia.  Normal urethral meatus, normal Bartholin's and Skene's glands.    Vagina: Normal vaginal mucosa, no evidence of prolapse.  Small pea size hard mass palpated near right vaginal wall at the introitus, not visible but easily palpable. Good tone   Cervix: Grossly normal in appearance, cyst present, no bleeding  Uterus: Non-enlarged, mobile, normal contour.  No CMT  Adnexa: ovaries non-enlarged, no  adnexal masses  Rectal: deferred  Lymphatic: no evidence of inguinal lymphadenopathy Extremities: no edema, erythema, or tenderness Neurologic: Grossly intact Psychiatric: mood appropriate, affect full     Assessment: 58 y.o. G2P0010 routine annual exam  Plan: Problem List Items Addressed This Visit   None Visit Diagnoses       Well woman exam    -  Primary   Relevant Orders   Cytology - PAP       1) Mammogram - recommend yearly screening mammogram.  Mammogram Is up to date  2) STI screening  wasoffered and declined  3) ASCCP guidelines and rational discussed.  Patient opts for every 5 years screening interval  4) Osteoporosis  - per USPTF routine screening DEXA at age 59   Consider FDA-approved medical therapies in postmenopausal women and men aged 27 years and older, based on the  following: a) A hip or vertebral (clinical or morphometric) fracture b) T-score <= -2.5 at the femoral neck or spine after appropriate evaluation to exclude secondary causes C) Low bone mass (T-score between -1.0 and -2.5 at the femoral neck or spine) and a 10-year probability of a hip fracture >= 3% or a 10-year probability of a major osteoporosis-related fracture >= 20% based on the [place] -adapted WHO algorithm   5) Routine healthcare maintenance including cholesterol, diabetes screening discussed managed by PCP  6) Colonoscopy up to date.  Screening recommended starting at age 47 for average risk individuals, age 31 for individuals deemed at increased risk (including African Americans) and recommended to continue until age 37.  For patient age 51-85 individualized approach is recommended.  Gold standard screening is via colonoscopy, Cologuard screening is an acceptable alternative for patient unwilling or unable to undergo colonoscopy.  "Colorectal cancer screening for average?risk adults: 2018 guideline update from the American Cancer Society"CA: A Cancer Journal for Clinicians: Nov 10, 2016    7)stress incontinence: discussed Kegel exercises and home exercises to strengthen core and pelvis. , she could try pelvic PT, which may be difficult as she is traveling, could self refer to [facility].   8)vaginal cyst: Binnie Buffalo CNM in to assess, agrees the concerning nodule is most likely a benign cyst. May continue to monitor, return if it increases in size or has tenderness. May see MD if you would like it removed.   9) Return in about 1 year (around 10/18/2024).   Anice Kerbs, CNM  [facility] 10/20/2023, 4:17 PM

## 2023-10-24 LAB — CYTOLOGY - PAP: Diagnosis: NEGATIVE
